# Patient Record
Sex: Female | Born: 2000 | Race: White | Hispanic: Yes | Marital: Single | Smoking: Never smoker
Health system: Southern US, Community
[De-identification: ages and names within clinical notes are randomized; demographics above are authoritative.]

## PROBLEM LIST (undated history)

## (undated) DIAGNOSIS — F32A Depression, unspecified: Secondary | ICD-10-CM

## (undated) DIAGNOSIS — F329 Major depressive disorder, single episode, unspecified: Secondary | ICD-10-CM

## (undated) DIAGNOSIS — F418 Other specified anxiety disorders: Secondary | ICD-10-CM

## (undated) HISTORY — DX: Major depressive disorder, single episode, unspecified: F32.9

## (undated) HISTORY — DX: Depression, unspecified: F32.A

## (undated) HISTORY — DX: Other specified anxiety disorders: F41.8

---
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
## 2018-01-04 NOTE — Telephone Encounter (Signed)
No call back - this encounter will be closed.  

## 2018-01-06 ENCOUNTER — Ambulatory Visit (INDEPENDENT_AMBULATORY_CARE_PROVIDER_SITE_OTHER): Payer: Medicaid Other | Admitting: Family Medicine

## 2018-01-06 ENCOUNTER — Encounter: Payer: Self-pay | Admitting: Family Medicine

## 2018-01-06 VITALS — BP 113/80 | HR 72 | Temp 97.9°F | Ht 59.0 in | Wt 133.0 lb

## 2018-01-06 DIAGNOSIS — F418 Other specified anxiety disorders: Secondary | ICD-10-CM | POA: Diagnosis not present

## 2018-01-06 MED ORDER — HYDROXYZINE HCL 25 MG PO TABS
12.5000 mg | ORAL_TABLET | Freq: Three times a day (TID) | ORAL | 0 refills | Status: DC | PRN
Start: 2018-01-06 — End: 2018-03-08

## 2018-01-06 MED ORDER — SERTRALINE HCL 50 MG PO TABS: 50.0000 mg | ORAL_TABLET | Freq: Every day | ORAL | 0 refills | Status: DC

## 2018-01-06 NOTE — Patient Instructions (Signed)
I have added hydroxyzine.  You may take 1/2 tablet to 1 full tablet every 8 hours if needed for anxiety, panic or sleep.  Again, this medication will cause drowsiness.  Do not drive while taking.  I would start with 1/2 tablet and see if this helps prior to increasing to full tablet.  I have sent your Zoloft in for 3 months.  Plan to see me in August when you return.  Taking the medicine as directed and not missing any doses is one of the best things you can do to treat your depression.  Here are some things to keep in mind:  1) Side effects (stomach upset, some increased anxiety) may happen before you notice a benefit.  These side effects typically go away over time. 2) Changes to your dose of medicine or a change in medication all together is sometimes necessary 3) Most people need to be on medication at least 12 months 4) Many people will notice an improvement within two weeks but the full effect of the medication can take up to 4-6 weeks 5) Stopping the medication when you start feeling better often results in a return of symptoms 6) Never discontinue your medication without contacting a health care professional first.  Some medications require gradual discontinuation/ taper and can make you sick if you stop them abruptly.  If your symptoms worsen or you have thoughts of suicide/homicide, PLEASE SEEK IMMEDIATE MEDICAL ATTENTION.  You may always call:  National Suicide Hotline: (825)134-4574(212) 436-6837 [place] Crisis Line: 365 399 0163763 [phone] Crisis Recovery in [place]: 216-774-3631([phone]   These are available 24 hours a day, 7 days a week.
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
## 2018-02-21 ENCOUNTER — Telehealth: Payer: Self-pay

## 2018-02-21 NOTE — Telephone Encounter (Signed)
[facility] - Left Msg 

## 2018-02-28 ENCOUNTER — Telehealth: Payer: Self-pay

## 2018-02-28 NOTE — Telephone Encounter (Signed)
[facility] - Left Msg on 02-21-2018.  [facility] was not able to leave a message on the patient's phone

## 2018-03-08 ENCOUNTER — Ambulatory Visit (INDEPENDENT_AMBULATORY_CARE_PROVIDER_SITE_OTHER): Payer: Medicaid Other | Admitting: Family Medicine

## 2018-03-08 ENCOUNTER — Encounter: Payer: Self-pay | Admitting: Family Medicine

## 2018-03-08 VITALS — BP 109/76 | HR 76 | Temp 97.8°F | Ht 59.01 in | Wt 130.8 lb

## 2018-03-08 DIAGNOSIS — F418 Other specified anxiety disorders: Secondary | ICD-10-CM | POA: Diagnosis not present

## 2018-03-08 MED ORDER — SERTRALINE HCL 100 MG PO TABS: 100.0000 mg | ORAL_TABLET | Freq: Every day | ORAL | 1 refills | Status: DC

## 2018-03-08 MED ORDER — HYDROXYZINE HCL 25 MG PO TABS: 12.5000 mg | ORAL_TABLET | Freq: Three times a day (TID) | ORAL | 0 refills | Status: DC | PRN
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
## 2018-04-05 ENCOUNTER — Ambulatory Visit: Payer: Medicaid Other | Admitting: Family Medicine

## 2018-04-10 ENCOUNTER — Encounter: Payer: Self-pay | Admitting: Family Medicine

## 2018-04-24 ENCOUNTER — Encounter: Payer: Self-pay | Admitting: Family Medicine

## 2018-04-24 ENCOUNTER — Ambulatory Visit (INDEPENDENT_AMBULATORY_CARE_PROVIDER_SITE_OTHER): Payer: Medicaid Other | Admitting: Family Medicine

## 2018-04-24 VITALS — BP 102/67 | HR 67 | Temp 99.4°F | Ht 59.0 in | Wt 129.0 lb

## 2018-04-24 DIAGNOSIS — F418 Other specified anxiety disorders: Secondary | ICD-10-CM | POA: Diagnosis not present

## 2018-04-24 DIAGNOSIS — F430 Acute stress reaction: Secondary | ICD-10-CM

## 2018-04-24 DIAGNOSIS — Z23 Encounter for immunization: Secondary | ICD-10-CM

## 2018-04-24 MED ORDER — HYDROXYZINE HCL 25 MG PO TABS: 12.5000 mg | ORAL_TABLET | Freq: Three times a day (TID) | ORAL | 0 refills | Status: AC | PRN

## 2018-04-24 MED ORDER — SERTRALINE HCL 100 MG PO TABS: 100.0000 mg | ORAL_TABLET | Freq: Every day | ORAL | 1 refills | Status: AC
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
## 2018-04-24 NOTE — Patient Instructions (Signed)
Stress and Stress Management Stress is a normal reaction to life events. It is what you feel when life demands more than you are used to or more than you can handle. Some stress can be useful. For example, the stress reaction can help you catch the last bus of the day, study for a test, or meet a deadline at work. But stress that occurs too often or for too long can cause problems. It can affect your emotional health and interfere with relationships and normal daily activities. Too much stress can weaken your immune system and increase your risk for physical illness. If you already have a medical problem, stress can make it worse. What are the causes? All sorts of life events may cause stress. An event that causes stress for one person may not be stressful for another person. Major life events commonly cause stress. These may be positive or negative. Examples include losing your job, moving into a new home, getting married, having a baby, or losing a loved one. Less obvious life events may also cause stress, especially if they occur day after day or in combination. Examples include working long hours, driving in traffic, caring for children, being in debt, or being in a difficult relationship. What are the signs or symptoms? Stress may cause emotional symptoms including, the following:  Anxiety. This is feeling worried, afraid, on edge, overwhelmed, or out of control.  Anger. This is feeling irritated or impatient.  Depression. This is feeling sad, down, helpless, or guilty.  Difficulty focusing, remembering, or making decisions.  Stress may cause physical symptoms, including the following:  Aches and pains. These may affect your head, neck, back, stomach, or other areas of your body.  Tight muscles or clenched jaw.  Low energy or trouble sleeping.  Stress may cause unhealthy behaviors, including the following:  Eating to feel better (overeating) or skipping meals.  Sleeping too little,  too much, or both.  Working too much or putting off tasks (procrastination).  Smoking, drinking alcohol, or using drugs to feel better.  How is this diagnosed? Stress is diagnosed through an assessment by your health care provider. Your health care provider will ask questions about your symptoms and any stressful life events.Your health care provider will also ask about your medical history and may order blood tests or other tests. Certain medical conditions and medicine can cause physical symptoms similar to stress. Mental illness can cause emotional symptoms and unhealthy behaviors similar to stress. Your health care provider may refer you to a mental health professional for further evaluation. How is this treated? Stress management is the recommended treatment for stress.The goals of stress management are reducing stressful life events and coping with stress in healthy ways. Techniques for reducing stressful life events include the following:  Stress identification. Self-monitor for stress and identify what causes stress for you. These skills may help you to avoid some stressful events.  Time management. Set your priorities, keep a calendar of events, and learn to say "no." These tools can help you avoid making too many commitments.  Techniques for coping with stress include the following:  Rethinking the problem. Try to think realistically about stressful events rather than ignoring them or overreacting. Try to find the positives in a stressful situation rather than focusing on the negatives.  Exercise. Physical exercise can release both physical and emotional tension. The key is to find a form of exercise you enjoy and do it regularly.  Relaxation techniques. These relax the body and  mind. Examples include yoga, meditation, tai chi, biofeedback, deep breathing, progressive muscle relaxation, listening to music, being out in nature, journaling, and other hobbies. Again, the key is to find  one or more that you enjoy and can do regularly.  Healthy lifestyle. Eat a balanced diet, get plenty of sleep, and do not smoke. Avoid using alcohol or drugs to relax.  Strong support network. Spend time with family, friends, or other people you enjoy being around.Express your feelings and talk things over with someone you trust.  Counseling or talktherapy with a mental health professional may be helpful if you are having difficulty managing stress on your own. Medicine is typically not recommended for the treatment of stress.Talk to your health care provider if you think you need medicine for symptoms of stress. Follow these instructions at home:  Keep all follow-up visits as directed by your health care provider.  Take all medicines as directed by your health care provider. Contact a health care provider if:  Your symptoms get worse or you start having new symptoms.  You feel overwhelmed by your problems and can no longer manage them on your own. Get help right away if:  You feel like hurting yourself or someone else. This information is not intended to replace advice given to you by your health care provider. Make sure you discuss any questions you have with your health care provider. Document Released: 01/05/2001 Document Revised: 12/18/2015 Document Reviewed: 03/06/2013 Elsevier Interactive Patient Education  2017 [employer]  

## 2018-07-24 ENCOUNTER — Ambulatory Visit: Payer: Medicaid Other | Admitting: Family Medicine

## 2018-07-28 ENCOUNTER — Encounter: Payer: Self-pay | Admitting: Family Medicine

## 2020-01-17 DIAGNOSIS — Z23 Encounter for immunization: Secondary | ICD-10-CM | POA: Diagnosis not present
